# Patient Record
Sex: Male | Born: 1975 | Race: Black or African American | Hispanic: No | Marital: Married | Smoking: Never smoker
Health system: Southern US, Community
[De-identification: ages and names within clinical notes are randomized; demographics above are authoritative.]

## PROBLEM LIST (undated history)

## (undated) ENCOUNTER — Ambulatory Visit: Admission: EM | Payer: Self-pay | Source: Home / Self Care

---

## 2003-10-13 ENCOUNTER — Emergency Department (HOSPITAL_COMMUNITY): Admission: EM | Admit: 2003-10-13 | Discharge: 2003-10-13 | Payer: Self-pay

## 2004-05-22 IMAGING — CT CT ABDOMEN W/ CM
1 of 4 series · 14 of 32 positions shown, 19 images · IV contrast (omnipaque)
Comparison: none

CLINICAL DATA: Abdominal pain, particularly in the right lower quadrant.
 CT ABDOMEN WITH CONTRAST
 Multidetector helical scans through the abdomen were performed after oral and IV contrast media were given.   150 cc of Omnipaque 300 were given as the contrast media.
 The lung bases are clear.  The liver enhances normally with no focal abnormality.  No calcified gallstones are noted.  The pancreas is normal in size as are the adrenal glands and spleen.  The kidneys enhance normally and on delayed images the pelvocaliceal systems appear normal.  the abdominal aorta is normal.
 IMPRESSION
 Negative CT scan of the abdomen.
 CT PELVIS WITH CONTRAST
 Scans were continued through the pelvis after oral and IV contrast media were given.  The appendix is well seen in the right lower quadrant and is normal in caliber, containing some air.  The urinary bladder is unremarkable.  There are a few nodes in the right lower quadrant and mesenteric adenitis is a consideration.  
 1.  The appendix is relatively well seen with no evidence of appendicitis.
 2.   There are some nodes in the right lower quadrant and mesenteric adenitis is a consideration.

[Series 2: abd/pelvis 5.0 b30f · axial · 0.68mm/px · z∈[-742,-322]mm · 14 of 97 slices shown, 19 images]
[im 7/97  soft-tissue]
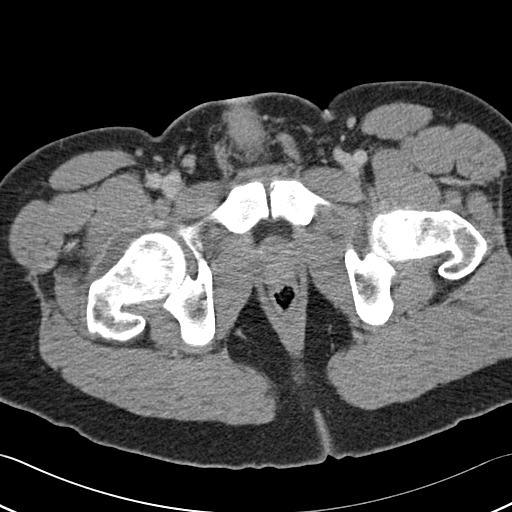
[im 7/97  bone]
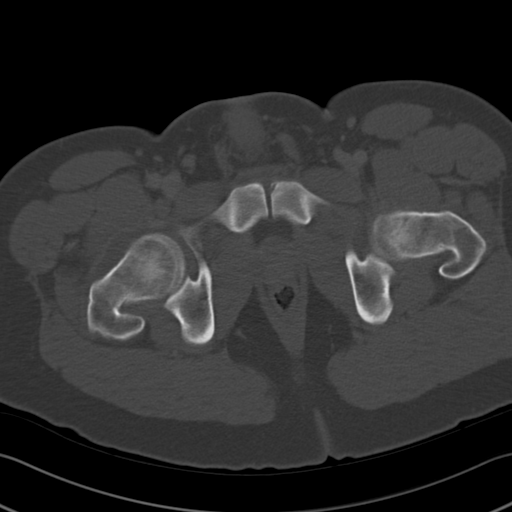
[im 13/97  soft-tissue]
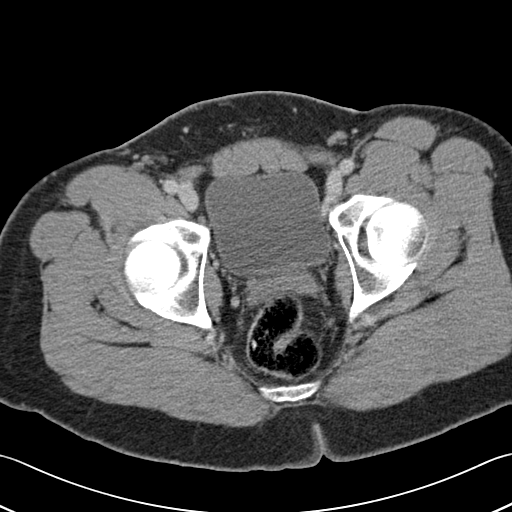
[im 19/97  soft-tissue]
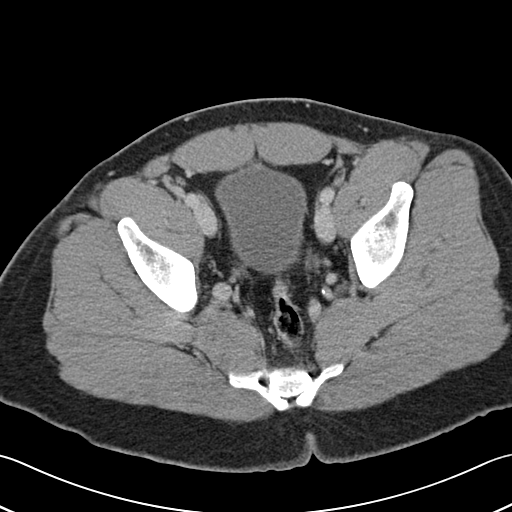
[im 31/97  soft-tissue]
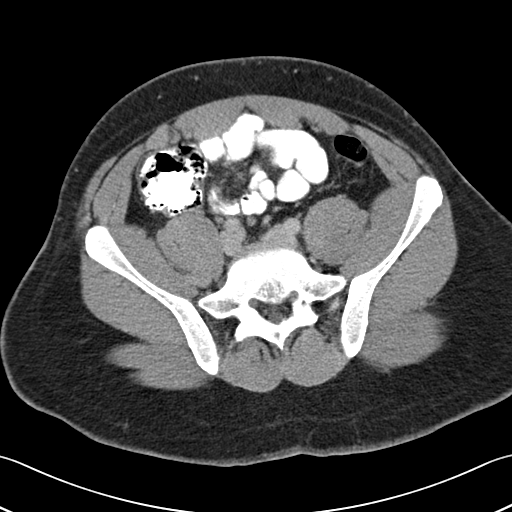
[im 37/97  soft-tissue]
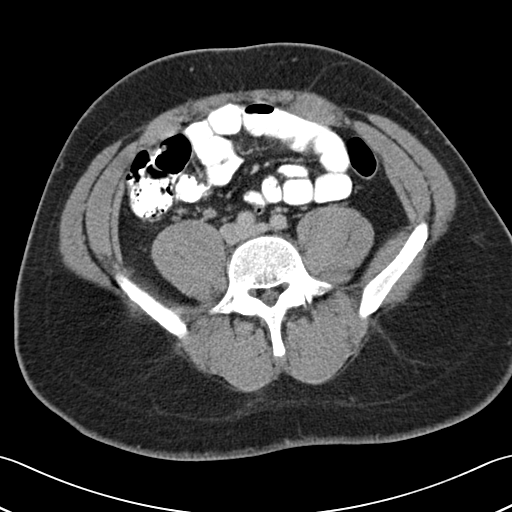
[im 43/97  soft-tissue]
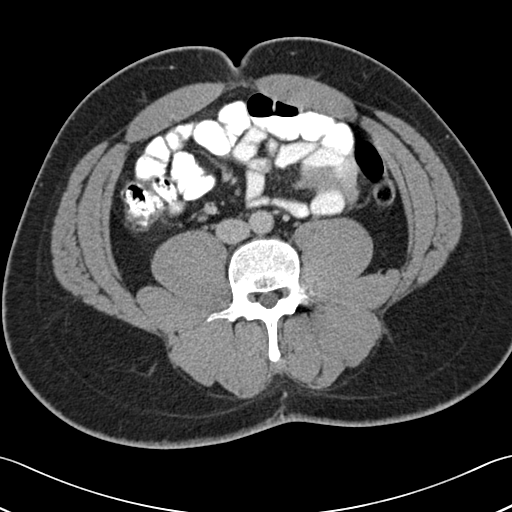
[im 49/97  soft-tissue]
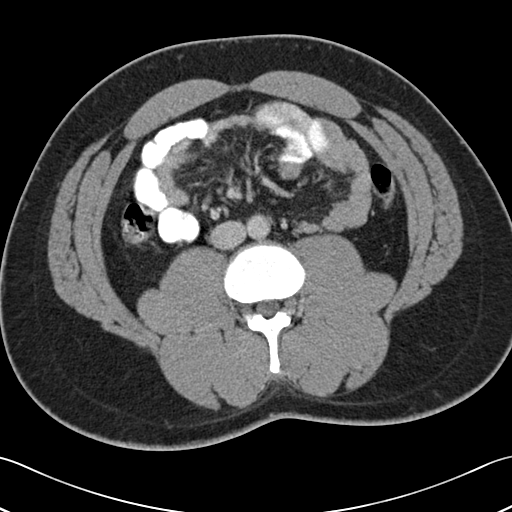
[im 55/97  soft-tissue]
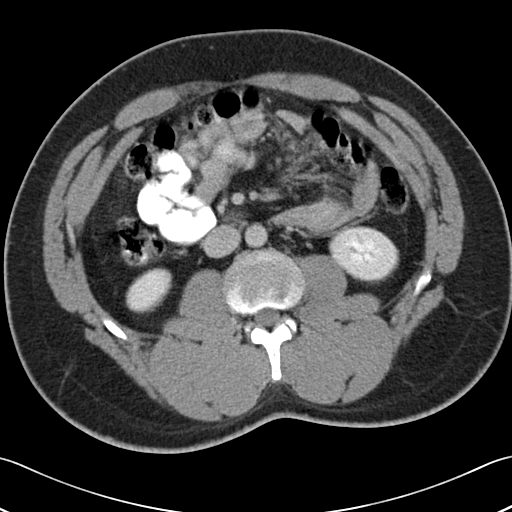
[im 61/97  soft-tissue]
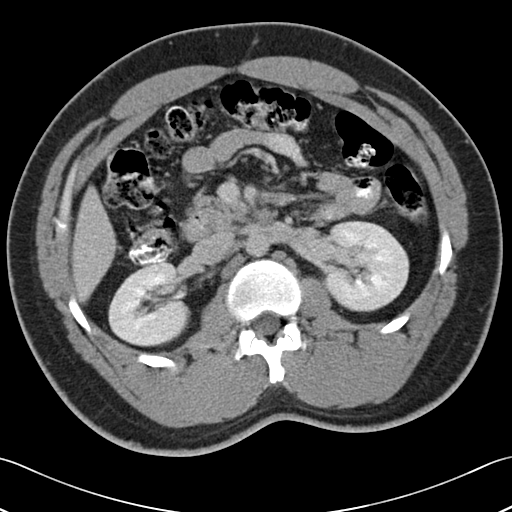
[im 61/97  bone]
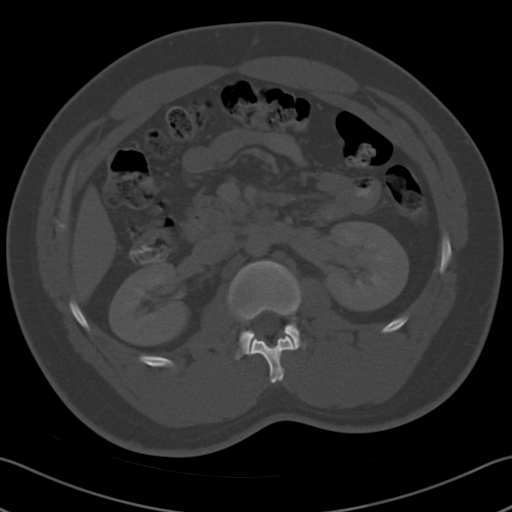
[im 67/97  soft-tissue]
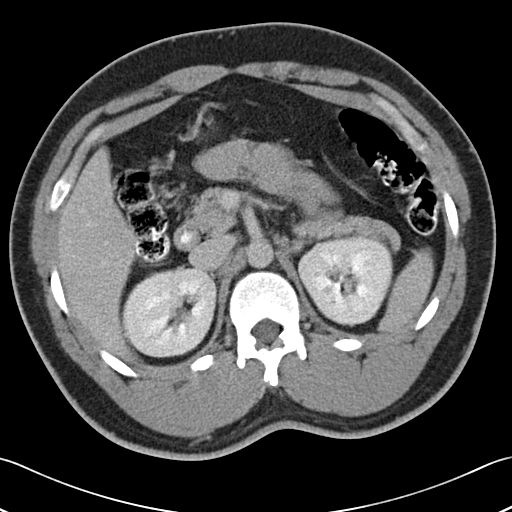
[im 73/97  lung]
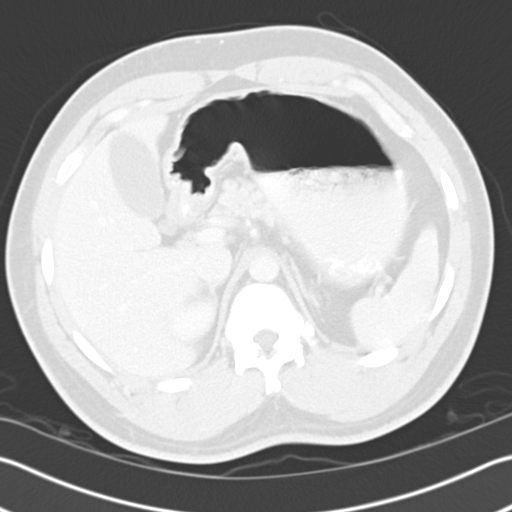
[im 79/97  soft-tissue]
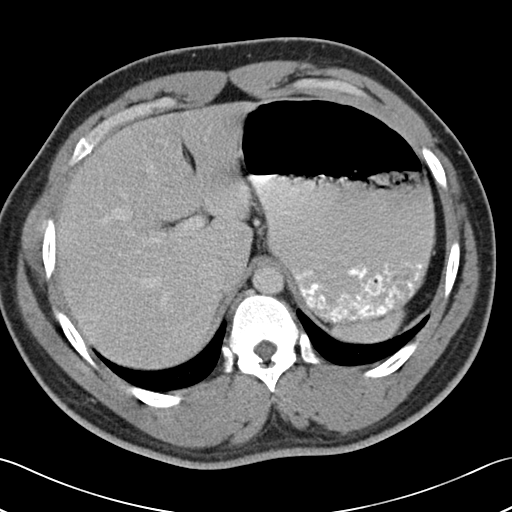
[im 79/97  lung]
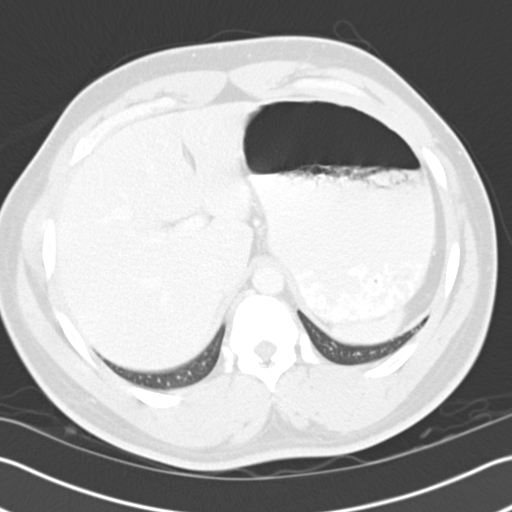
[im 85/97  soft-tissue]
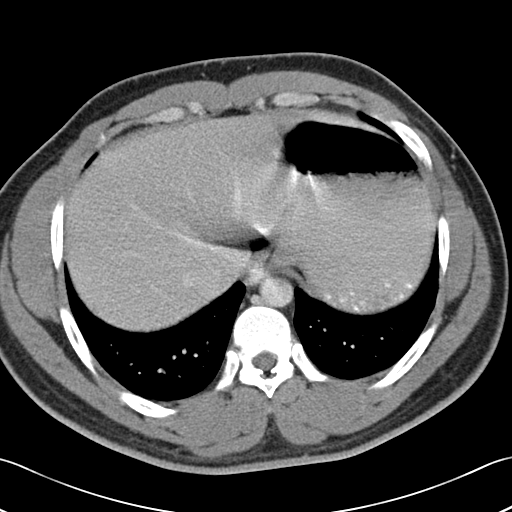
[im 85/97  lung]
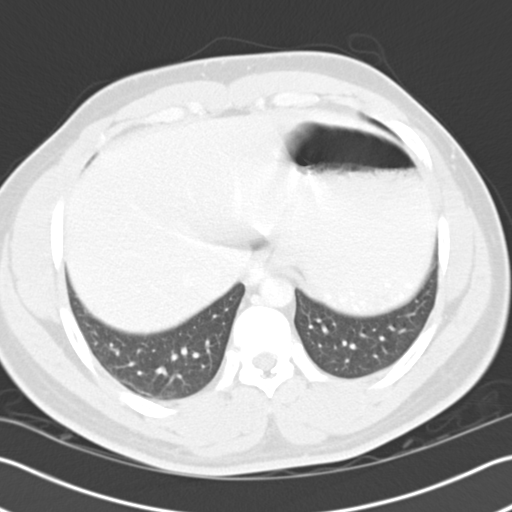
[im 91/97  soft-tissue]
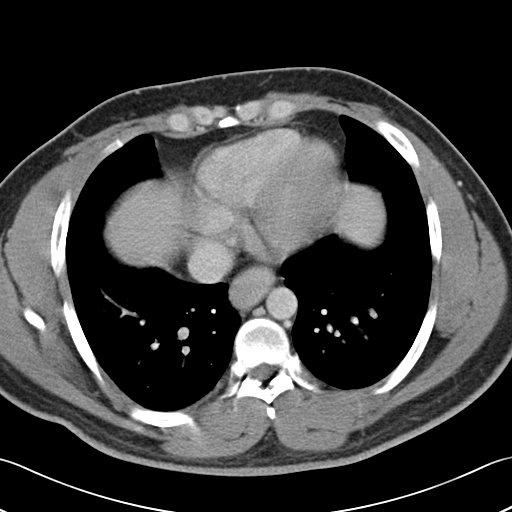
[im 91/97  lung]
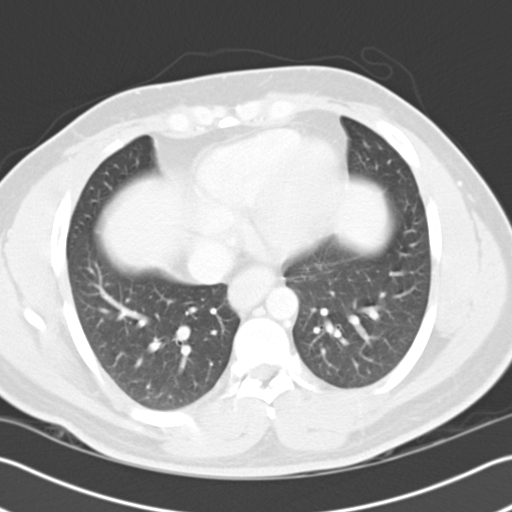

[14 of 32 positions shown; findings below may reference images not displayed]

## 2007-02-24 ENCOUNTER — Emergency Department (HOSPITAL_COMMUNITY): Admission: EM | Admit: 2007-02-24 | Discharge: 2007-02-24 | Payer: Self-pay | Admitting: Emergency Medicine

## 2016-12-24 ENCOUNTER — Emergency Department (HOSPITAL_COMMUNITY): Payer: Self-pay

## 2016-12-24 ENCOUNTER — Encounter (HOSPITAL_COMMUNITY): Payer: Self-pay | Admitting: Emergency Medicine

## 2016-12-24 ENCOUNTER — Emergency Department (HOSPITAL_COMMUNITY)
Admission: EM | Admit: 2016-12-24 | Discharge: 2016-12-24 | Disposition: A | Discharge status: Home / Self Care | Payer: Self-pay | Attending: Emergency Medicine | Admitting: Emergency Medicine

## 2016-12-24 DIAGNOSIS — Y939 Activity, unspecified: Secondary | ICD-10-CM | POA: Insufficient documentation

## 2016-12-24 DIAGNOSIS — S96911A Strain of unspecified muscle and tendon at ankle and foot level, right foot, initial encounter: Secondary | ICD-10-CM | POA: Insufficient documentation

## 2016-12-24 DIAGNOSIS — W1789XA Other fall from one level to another, initial encounter: Secondary | ICD-10-CM | POA: Insufficient documentation

## 2016-12-24 DIAGNOSIS — Y929 Unspecified place or not applicable: Secondary | ICD-10-CM | POA: Insufficient documentation

## 2016-12-24 DIAGNOSIS — Y999 Unspecified external cause status: Secondary | ICD-10-CM | POA: Insufficient documentation

## 2016-12-24 DIAGNOSIS — S0990XA Unspecified injury of head, initial encounter: Secondary | ICD-10-CM | POA: Insufficient documentation

## 2016-12-24 MED ORDER — HYDROCODONE-ACETAMINOPHEN 5-325 MG PO TABS
1.0000 | ORAL_TABLET | Freq: Once | ORAL | Status: AC
  Administered 2016-12-24: 1 via ORAL
  Filled 2016-12-24: qty 1

## 2016-12-24 NOTE — Discharge Instructions (Signed)
Please take ibuprofen and Tylenol for pain control. Keep your right foot elevated at rest and ice to keep swelling down. Use compression dressing is needed. You can walk as tolerated.  Return without fail for worsening symptoms, including confusion, intractable vomiting, escalating pain, inability to walk, or any other symptoms concerning to you.

## 2016-12-24 NOTE — ED Notes (Signed)
Patient transported to X-ray 

## 2016-12-24 NOTE — ED Provider Notes (Signed)
[facility]-EMERGENCY DEPT Provider Note   CSN: [id number] Arrival date & time: 12/24/16  1732     History   Chief Complaint Chief Complaint  Patient presents with  . Fall  . Head Injury    HPI CAMEO SHEWELL is a 41 y.o. male.  HPI 41 year old male who presents after fall. No blood thinners. No significant PMH. States he was trying to get down form attic when the floorboard underneath him gave out. He fell through ceiling, and his right foot got caught in the floor board. He was initially dangling in the air, and then fell on his head. Heigh of about 8 ft. No significant LOC but c/o severe headache and scalp hematoma. No n/v, focal numbness/weakness, confusion. Was initially able to ambulate but complaining of pain in the right ankle. Also with generalized neck pain and stiffness. No chest pain, abd pain.   History reviewed. No pertinent past medical history.  There are no active problems to display for this patient.   History reviewed. No pertinent surgical history.     Home Medications    Prior to Admission medications   Not on File    Family History No family history on file.  Social History Social History  Substance Use Topics  . Smoking status: Never Smoker  . Smokeless tobacco: Never Used  . Alcohol use No     Allergies   Patient has no allergy information on record.   Review of Systems Review of Systems 10/14 systems reviewed and are negative other than those stated in the HPI   Physical Exam Updated Vital Signs BP (!) 138/94 (BP Location: Right Arm)   Pulse 97   Temp 98.3 F (36.8 C) (Oral)   Resp 18   Ht 5\' 11"  (1.803 m)   Wt 240 lb (108.9 kg)   SpO2 97%   BMI 33.47 kg/m   Physical Exam Physical Exam  Nursing note and vitals reviewed. Constitutional: Non-toxic, and in no acute distress Head: Normocephalic and atraumatic.  Mouth/Throat: Oropharynx is clear and moist.  Neck: Normal range of motion. Neck supple. diffuse generalized  tenderness without step-offs or deformity Cardiovascular: Normal rate and regular rhythm.  +2 dp pulses Pulmonary/Chest: Effort normal and breath sounds normal. no chest wall tenderness Abdominal: Soft. There is no tenderness. There is no rebound and no guarding.  Musculoskeletal: ROM limited in right ankle due to pain with mild swelling of soft tissue over lateral foot and ankle.  Neurological: Alert, no facial droop, fluent speech, sensation to light touch in tact throughout, PERRL, EOMI, full strength in bilateral upper and lower extremities Skin: Skin is warm and dry.  Psychiatric: Cooperative   ED Treatments / Results  Labs (all labs ordered are listed, but only abnormal results are displayed) Labs Reviewed - No data to display  EKG  EKG Interpretation None       Radiology Dg Ankle Complete Right  Result Date: 12/24/2016 CLINICAL DATA:  Status post fall out of attic, with lateral right ankle pain. Initial encounter. EXAM: RIGHT ANKLE - COMPLETE 3+ VIEW COMPARISON:  None. FINDINGS: There is no evidence of fracture or dislocation. The ankle mortise is intact; the interosseous space is within normal limits. No talar tilt or subluxation is seen. The joint spaces are preserved. No significant soft tissue abnormalities are seen. IMPRESSION: No evidence of fracture or dislocation. Electronically Signed   By: Roanna Raider M.D.   On: 12/24/2016 18:24   Ct Head Wo Contrast  Result  Date: 12/24/2016 CLINICAL DATA:  Status post fall 8 foot off attic. Hit head on floor. Concern for head or cervical spine injury. Initial encounter. EXAM: CT HEAD WITHOUT CONTRAST CT CERVICAL SPINE WITHOUT CONTRAST TECHNIQUE: Multidetector CT imaging of the head and cervical spine was performed following the standard protocol without intravenous contrast. Multiplanar CT image reconstructions of the cervical spine were also generated. COMPARISON:  None. FINDINGS: CT HEAD FINDINGS Brain: No evidence of acute  infarction, hemorrhage, hydrocephalus, extra-axial collection or mass lesion/mass effect. The posterior fossa, including the cerebellum, brainstem and fourth ventricle, is within normal limits. The third and lateral ventricles, and basal ganglia are unremarkable in appearance. The cerebral hemispheres are symmetric in appearance, with normal gray-white differentiation. No mass effect or midline shift is seen. Vascular: No hyperdense vessel or unexpected calcification. Skull: There is no evidence of fracture; visualized osseous structures are unremarkable in appearance. Sinuses/Orbits: The orbits are within normal limits. The paranasal sinuses and mastoid air cells are well-aerated. Other: Soft tissue swelling is noted at the vertex. CT CERVICAL SPINE FINDINGS Alignment: Normal. Skull base and vertebrae: No acute fracture. No primary bone lesion or focal pathologic process. Soft tissues and spinal canal: No prevertebral fluid or swelling. No visible canal hematoma. Disc levels: Intervertebral disc spaces are preserved. The bony foramina are grossly unremarkable in appearance. Upper chest: The visualized lung apices are clear. The thyroid gland is unremarkable in appearance. Other: No additional soft tissue abnormalities are seen. IMPRESSION: 1. No evidence of traumatic intracranial injury or fracture. 2. No evidence of fracture or subluxation along the cervical spine. 3. Soft tissue swelling at the vertex. Electronically Signed   By: Roanna Raider M.D.   On: 12/24/2016 18:53   Ct Cervical Spine Wo Contrast  Result Date: 12/24/2016 CLINICAL DATA:  Status post fall 8 foot off attic. Hit head on floor. Concern for head or cervical spine injury. Initial encounter. EXAM: CT HEAD WITHOUT CONTRAST CT CERVICAL SPINE WITHOUT CONTRAST TECHNIQUE: Multidetector CT imaging of the head and cervical spine was performed following the standard protocol without intravenous contrast. Multiplanar CT image reconstructions of the  cervical spine were also generated. COMPARISON:  None. FINDINGS: CT HEAD FINDINGS Brain: No evidence of acute infarction, hemorrhage, hydrocephalus, extra-axial collection or mass lesion/mass effect. The posterior fossa, including the cerebellum, brainstem and fourth ventricle, is within normal limits. The third and lateral ventricles, and basal ganglia are unremarkable in appearance. The cerebral hemispheres are symmetric in appearance, with normal gray-white differentiation. No mass effect or midline shift is seen. Vascular: No hyperdense vessel or unexpected calcification. Skull: There is no evidence of fracture; visualized osseous structures are unremarkable in appearance. Sinuses/Orbits: The orbits are within normal limits. The paranasal sinuses and mastoid air cells are well-aerated. Other: Soft tissue swelling is noted at the vertex. CT CERVICAL SPINE FINDINGS Alignment: Normal. Skull base and vertebrae: No acute fracture. No primary bone lesion or focal pathologic process. Soft tissues and spinal canal: No prevertebral fluid or swelling. No visible canal hematoma. Disc levels: Intervertebral disc spaces are preserved. The bony foramina are grossly unremarkable in appearance. Upper chest: The visualized lung apices are clear. The thyroid gland is unremarkable in appearance. Other: No additional soft tissue abnormalities are seen. IMPRESSION: 1. No evidence of traumatic intracranial injury or fracture. 2. No evidence of fracture or subluxation along the cervical spine. 3. Soft tissue swelling at the vertex. Electronically Signed   By: Roanna Raider M.D.   On: 12/24/2016 18:53    Procedures Procedures (  including critical care time)  Medications Ordered in ED Medications  HYDROcodone-acetaminophen (NORCO/VICODIN) 5-325 MG per tablet 1 tablet (1 tablet Oral Given 12/24/16 1813)     Initial Impression / Assessment and Plan / ED Course  I have reviewed the triage vital signs and the nursing  notes.  Pertinent labs & imaging results that were available during my care of the patient were reviewed by me and considered in my medical decision making (see chart for details).     41 year old male otherwise healthy who presents after fall from attic about 8 feet. He was initially ambulatory. Well-appearing and in no acute distress. With scalp hematoma. Neurologically intact. Right lateral ankle swelling noted mildly. No other injuries noted on exam. CT head and cervical spine obtained given the mechanism of injury. This visualized and shows no acute traumatic injuries of the head and neck. X-ray of the right ankle also visualized and does not show evidence of fracture. Suspect ankle strain and likely concussive. Head injury. I discussed supportive management. Strict return and follow-up instructions reviewed. He expressed understanding of all discharge instructions and felt comfortable with the plan of care.   Final Clinical Impressions(s) / ED Diagnoses   Final diagnoses:  Injury of head, initial encounter  Strain of right ankle, initial encounter    New Prescriptions New Prescriptions   No medications on file     Lavera Guiseana Duo Yalexa Blust, MD 12/24/16 1911

## 2016-12-24 NOTE — ED Triage Notes (Signed)
Patient fell off 8 ft attic. Patient's right foot got hung in the ladder rung and then patient fell to the floor, hitting his head on the floor. Denies blood thinner use or loss of consciousness. Patient reports head, neck, and left foot pain. Patient is conscious, alert, oriented, ambulatory.

## 2017-08-03 IMAGING — CR DG ANKLE COMPLETE 3+V*R*
3 series · 3 of 3 positions shown · non-contrast
Comparison: None.

CLINICAL DATA: Status post fall out of attic, with lateral right
ankle pain. Initial encounter.

EXAM:
RIGHT ANKLE - COMPLETE 3+ VIEW

[x ankle ap right]
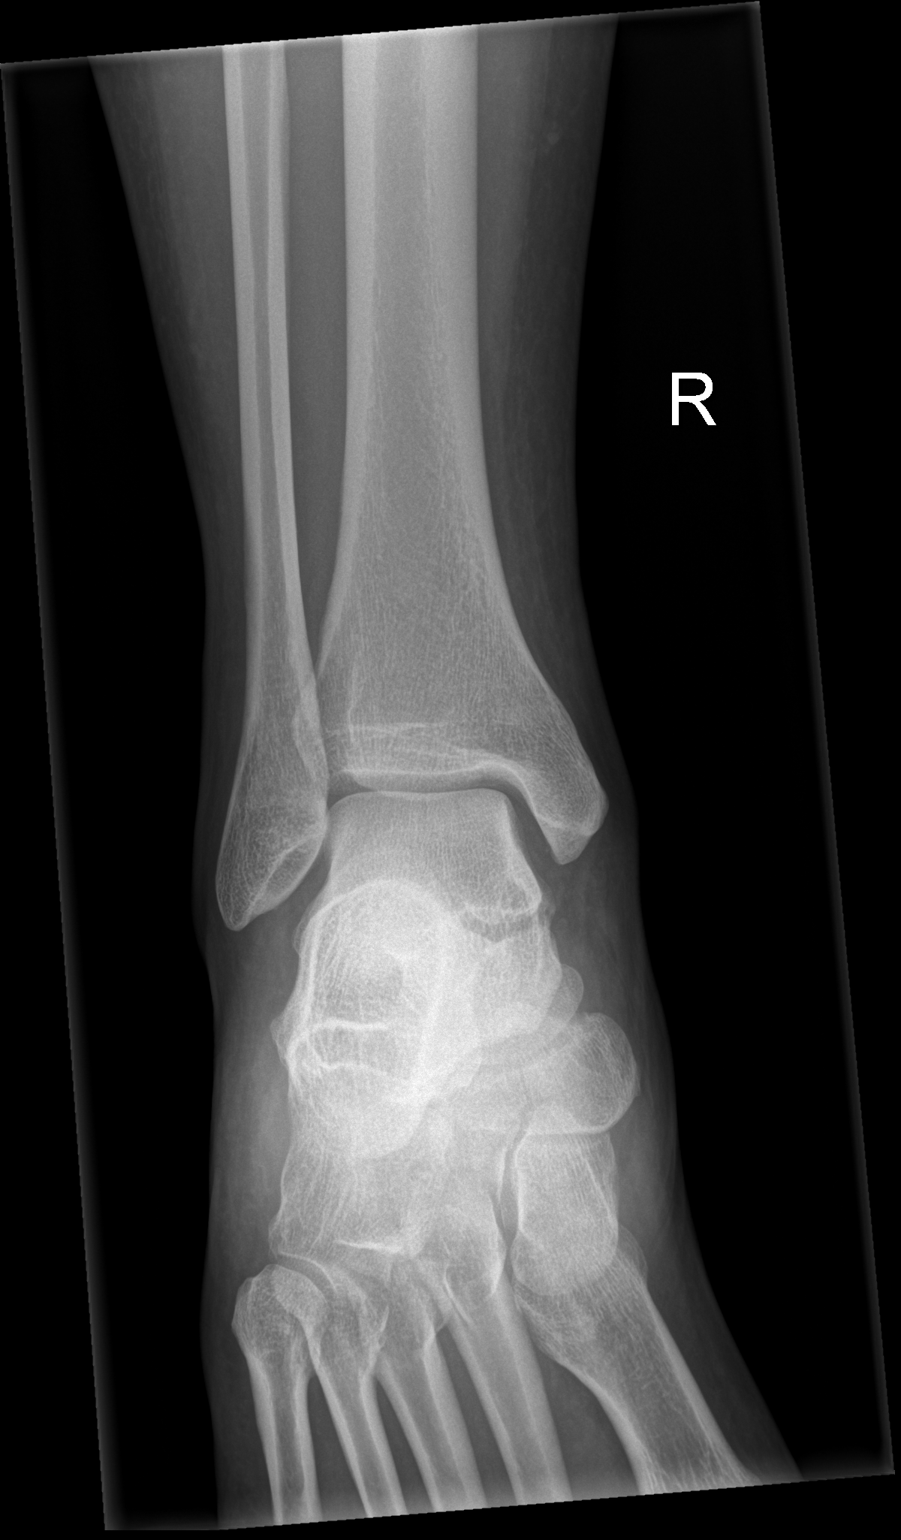

[x ankle obl right]
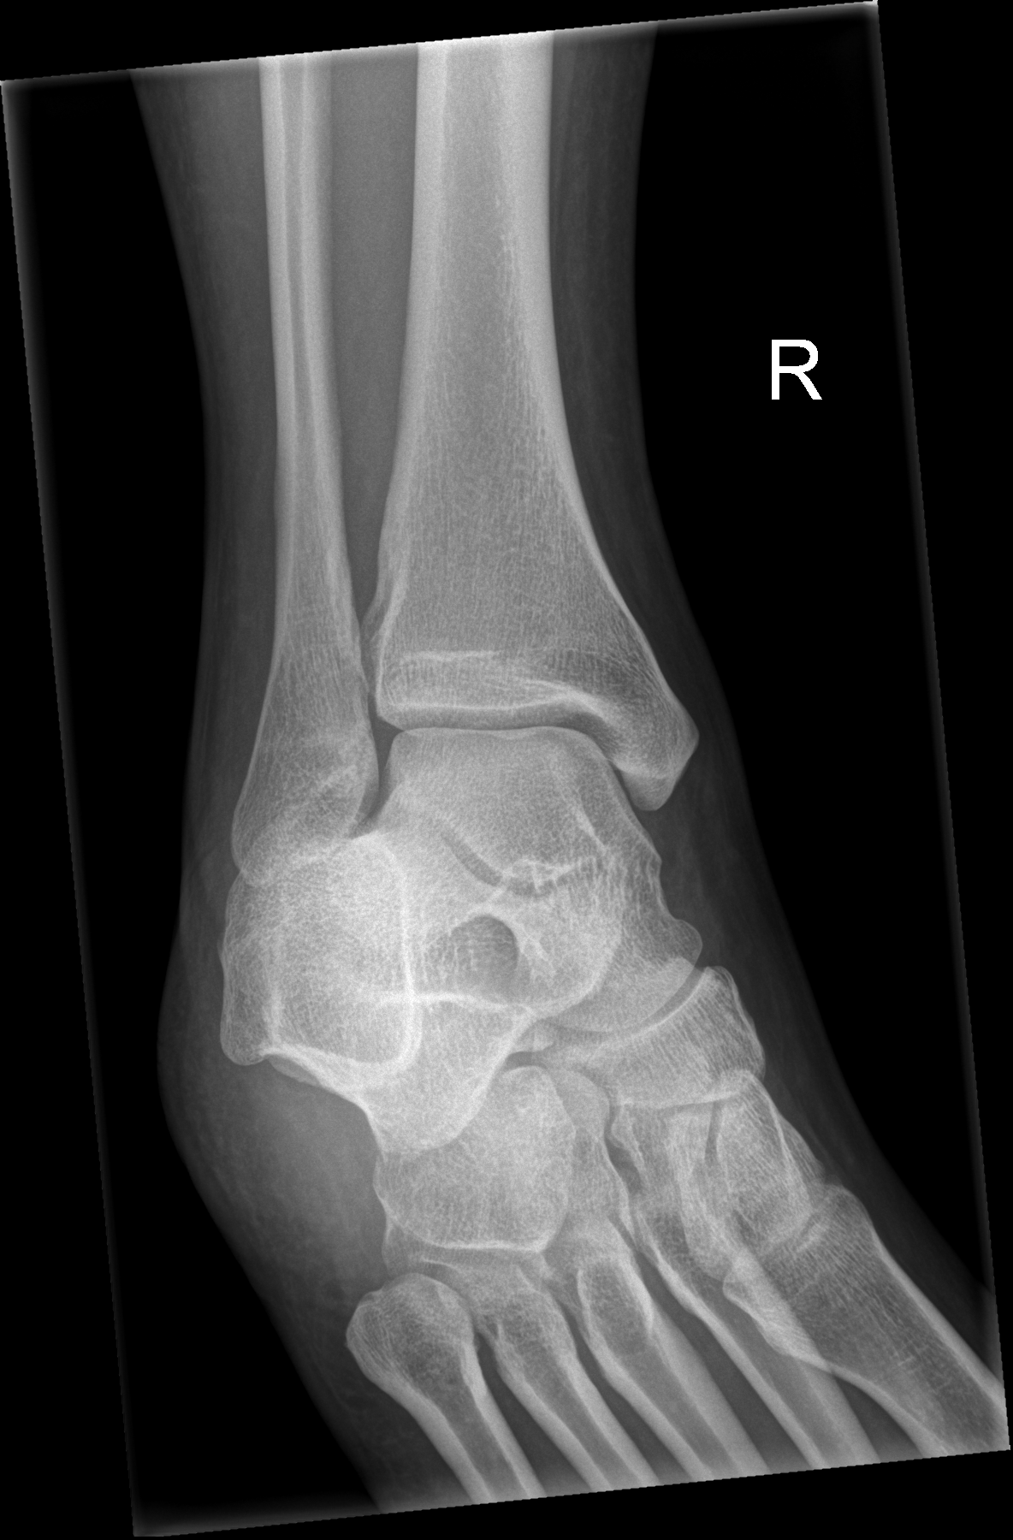

[x ankle lat right]
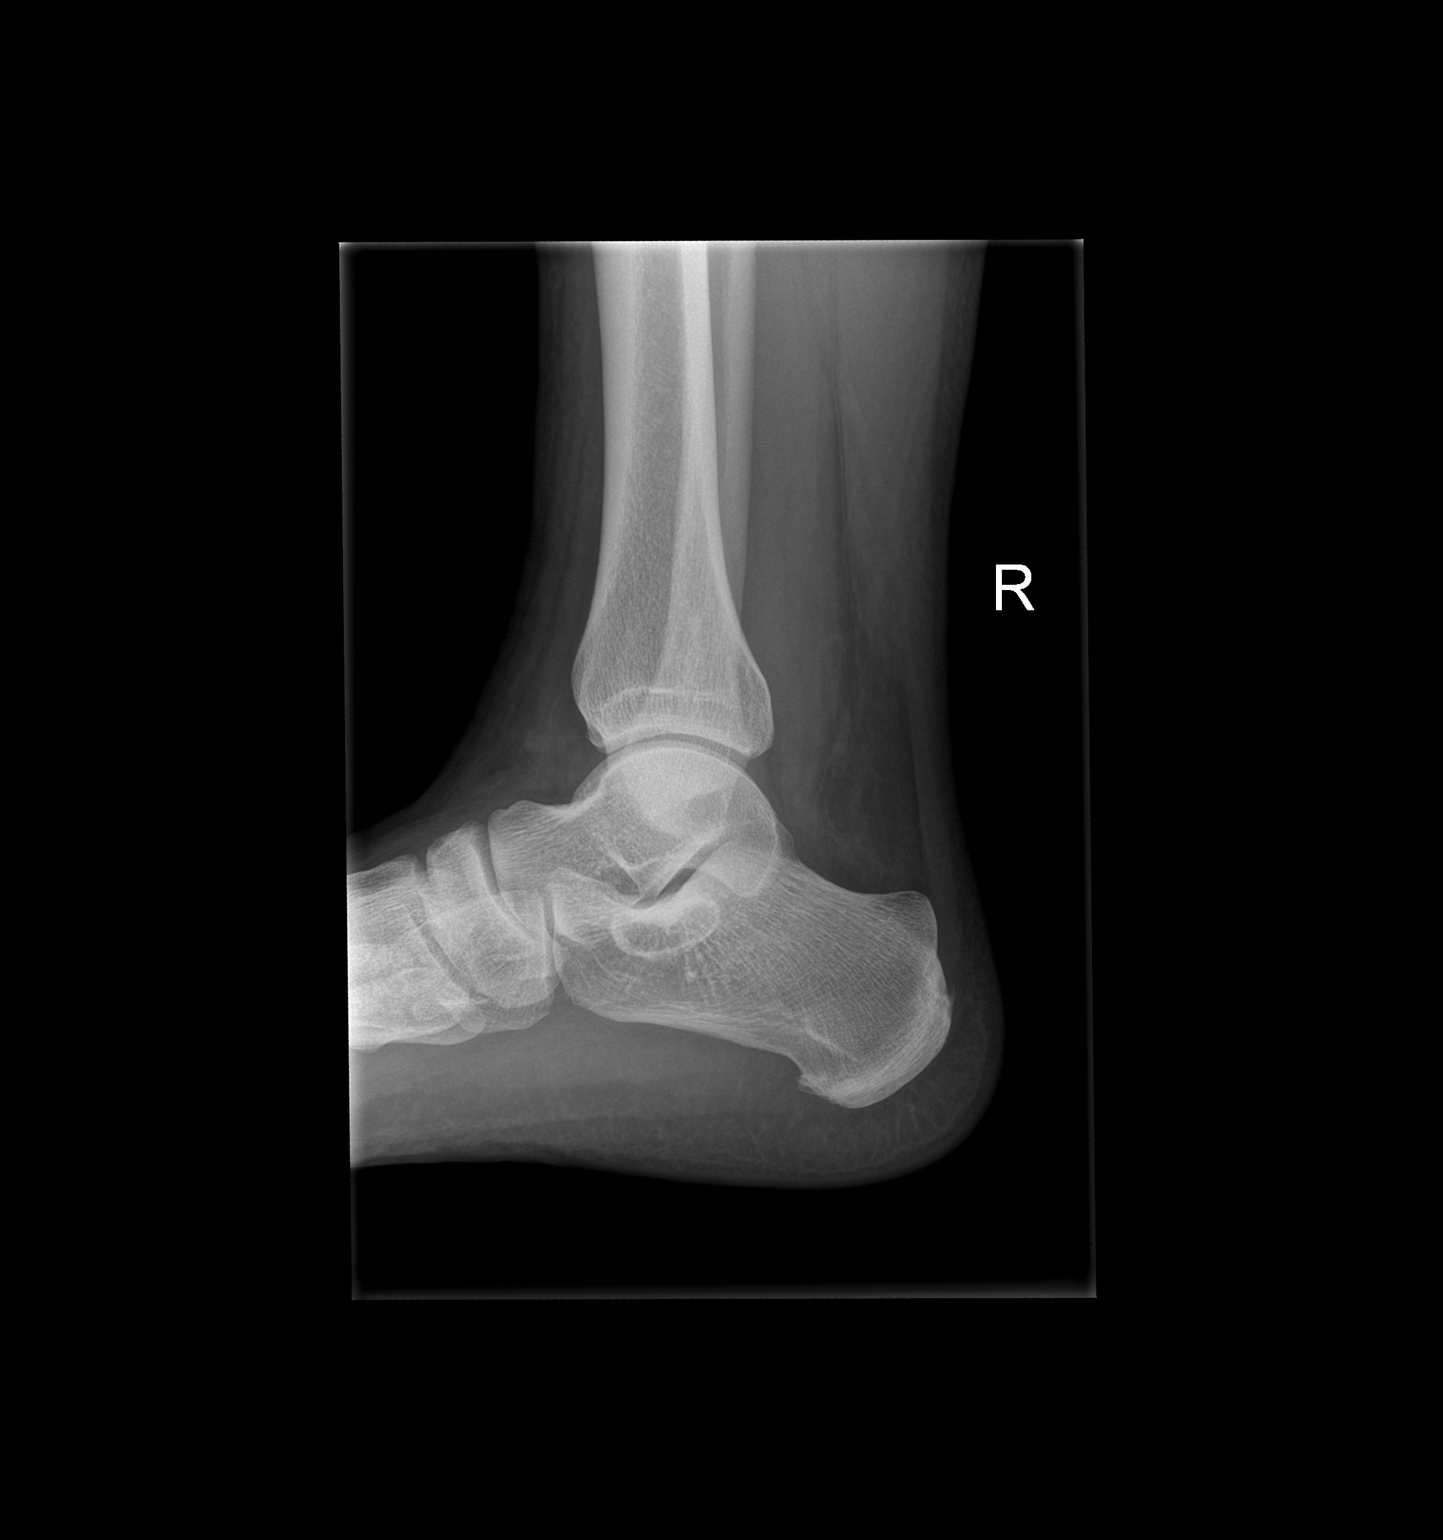

[3 of 3 positions shown; findings below may reference images not displayed]

FINDINGS: There is no evidence of fracture or dislocation. The ankle mortise
is intact; the interosseous space is within normal limits. No talar
tilt or subluxation is seen.

The joint spaces are preserved. No significant soft tissue
abnormalities are seen.
IMPRESSION: No evidence of fracture or dislocation.

## 2018-01-28 DIAGNOSIS — M25512 Pain in left shoulder: Secondary | ICD-10-CM | POA: Diagnosis not present

## 2018-02-09 DIAGNOSIS — M25512 Pain in left shoulder: Secondary | ICD-10-CM | POA: Diagnosis not present

## 2018-02-09 DIAGNOSIS — S46012D Strain of muscle(s) and tendon(s) of the rotator cuff of left shoulder, subsequent encounter: Secondary | ICD-10-CM | POA: Diagnosis not present

## 2018-02-16 DIAGNOSIS — N62 Hypertrophy of breast: Secondary | ICD-10-CM | POA: Diagnosis not present

## 2018-02-16 DIAGNOSIS — M722 Plantar fascial fibromatosis: Secondary | ICD-10-CM | POA: Diagnosis not present

## 2018-03-11 DIAGNOSIS — N62 Hypertrophy of breast: Secondary | ICD-10-CM | POA: Diagnosis not present

## 2018-04-19 DIAGNOSIS — N62 Hypertrophy of breast: Secondary | ICD-10-CM | POA: Diagnosis not present

## 2018-04-19 DIAGNOSIS — E785 Hyperlipidemia, unspecified: Secondary | ICD-10-CM | POA: Diagnosis not present

## 2018-04-26 DIAGNOSIS — E785 Hyperlipidemia, unspecified: Secondary | ICD-10-CM | POA: Diagnosis not present

## 2018-04-26 DIAGNOSIS — Z0000 Encounter for general adult medical examination without abnormal findings: Secondary | ICD-10-CM | POA: Diagnosis not present

## 2023-09-14 DIAGNOSIS — R509 Fever, unspecified: Secondary | ICD-10-CM | POA: Diagnosis not present

## 2023-09-14 DIAGNOSIS — J019 Acute sinusitis, unspecified: Secondary | ICD-10-CM | POA: Diagnosis not present

## 2023-09-14 DIAGNOSIS — R5381 Other malaise: Secondary | ICD-10-CM | POA: Diagnosis not present

## 2023-09-14 DIAGNOSIS — R051 Acute cough: Secondary | ICD-10-CM | POA: Diagnosis not present

## 2023-09-14 DIAGNOSIS — R430 Anosmia: Secondary | ICD-10-CM | POA: Diagnosis not present

## 2024-02-19 DIAGNOSIS — M545 Low back pain, unspecified: Secondary | ICD-10-CM | POA: Diagnosis not present

## 2024-02-19 DIAGNOSIS — M25552 Pain in left hip: Secondary | ICD-10-CM | POA: Diagnosis not present

## 2024-02-19 DIAGNOSIS — M25551 Pain in right hip: Secondary | ICD-10-CM | POA: Diagnosis not present

## 2024-04-04 DIAGNOSIS — M25551 Pain in right hip: Secondary | ICD-10-CM | POA: Diagnosis not present

## 2024-04-18 DIAGNOSIS — M5416 Radiculopathy, lumbar region: Secondary | ICD-10-CM | POA: Diagnosis not present

## 2024-04-18 DIAGNOSIS — M6281 Muscle weakness (generalized): Secondary | ICD-10-CM | POA: Diagnosis not present

## 2024-04-18 DIAGNOSIS — S39012D Strain of muscle, fascia and tendon of lower back, subsequent encounter: Secondary | ICD-10-CM | POA: Diagnosis not present

## 2024-06-28 DIAGNOSIS — R21 Rash and other nonspecific skin eruption: Secondary | ICD-10-CM | POA: Diagnosis not present

## 2024-07-08 DIAGNOSIS — Z1212 Encounter for screening for malignant neoplasm of rectum: Secondary | ICD-10-CM | POA: Diagnosis not present

## 2024-07-12 DIAGNOSIS — E785 Hyperlipidemia, unspecified: Secondary | ICD-10-CM | POA: Diagnosis not present

## 2024-07-12 DIAGNOSIS — Z0000 Encounter for general adult medical examination without abnormal findings: Secondary | ICD-10-CM | POA: Diagnosis not present

## 2024-07-19 DIAGNOSIS — B009 Herpesviral infection, unspecified: Secondary | ICD-10-CM | POA: Diagnosis not present

## 2024-07-19 DIAGNOSIS — E785 Hyperlipidemia, unspecified: Secondary | ICD-10-CM | POA: Diagnosis not present

## 2024-07-19 DIAGNOSIS — Z0000 Encounter for general adult medical examination without abnormal findings: Secondary | ICD-10-CM | POA: Diagnosis not present

## 2024-07-19 DIAGNOSIS — R739 Hyperglycemia, unspecified: Secondary | ICD-10-CM | POA: Diagnosis not present

## 2024-09-03 DIAGNOSIS — H5712 Ocular pain, left eye: Secondary | ICD-10-CM | POA: Diagnosis not present
